# Patient Record
Sex: Female | Born: 2000 | Race: Black or African American | Hispanic: No | Marital: Single | Smoking: Never smoker
Health system: Southern US, Community
[De-identification: ages and names within clinical notes are randomized; demographics above are authoritative.]

---

## 2019-12-07 ENCOUNTER — Encounter (HOSPITAL_COMMUNITY): Payer: Self-pay | Admitting: Emergency Medicine

## 2019-12-07 ENCOUNTER — Emergency Department (HOSPITAL_COMMUNITY)
Admission: EM | Admit: 2019-12-07 | Discharge: 2019-12-07 | Disposition: A | Discharge status: Home / Self Care | Payer: BC Managed Care – PPO | Attending: Emergency Medicine | Admitting: Emergency Medicine

## 2019-12-07 ENCOUNTER — Other Ambulatory Visit: Payer: Self-pay

## 2019-12-07 DIAGNOSIS — Z3202 Encounter for pregnancy test, result negative: Secondary | ICD-10-CM | POA: Diagnosis not present

## 2019-12-07 DIAGNOSIS — N926 Irregular menstruation, unspecified: Secondary | ICD-10-CM | POA: Diagnosis present

## 2019-12-07 LAB — I-STAT BETA HCG BLOOD, ED (MC, WL, AP ONLY): I-stat hCG, quantitative: <5 m[IU]/mL (ref <5)

## 2019-12-07 NOTE — Discharge Instructions (Signed)
Follow up with primary care doctor or Obgyn

## 2019-12-07 NOTE — ED Triage Notes (Signed)
Pt reports she is 8 days late for her period. LMP 11/01/19.

## 2019-12-07 NOTE — ED Notes (Signed)
Patient verbalizes understanding of discharge instructions. Opportunity for questioning and answers were provided. Armband removed by staff, pt discharged from ED ambulatory by self\  

## 2019-12-07 NOTE — ED Provider Notes (Signed)
[facility] EMERGENCY DEPARTMENT Provider Note   CSN: [id number] Arrival date & time: 12/07/19  2220     History Chief Complaint  Patient presents with  . Possible Pregnancy    Karen Maynard is a 19 y.o. female with no significant past medical history who presents for evaluation of abnormal menstrual cycle.  Sexually active with 1 female partner.  Denies concerns for STDs.  Denies fever, chills, nausea vomiting, chest pain, shortness of breath abdominal pain, pelvic pain, vaginal discharge, dysuria or constipation.  LMP 11/01/2019.  States she normally has consistent cycles.  She is not followed by PCP or OB/GYN.  Concern for chance of pregnancy and here to have testing.  History obtained from patient and past medical records.  No interpreter is used.  HPI     History reviewed. No pertinent past medical history.  There are no problems to display for this patient.   History reviewed. No pertinent surgical history.   OB History   No obstetric history on file.     No family history on file.  Social History   Tobacco Use  . Smoking status: Not on file  Substance Use Topics  . Alcohol use: Not on file  . Drug use: Not on file    Home Medications Prior to Admission medications   Not on File    Allergies    Patient has no known allergies.  Review of Systems   Review of Systems  HENT: Negative.   Respiratory: Negative.   Cardiovascular: Negative.   Gastrointestinal: Negative.   Genitourinary: Positive for menstrual problem. Negative for decreased urine volume, difficulty urinating, dysuria, flank pain, frequency, genital sores, hematuria, pelvic pain, urgency, vaginal bleeding, vaginal discharge and vaginal pain.  Musculoskeletal: Negative.   Skin: Negative.   Neurological: Negative.   All other systems reviewed and are negative.   Physical Exam Updated Vital Signs BP 96/83 (BP Location: Right Arm)   Pulse 85   Temp 98.6 F (37 C) (Oral)    Resp 16   LMP 11/01/2019   SpO2 100%   Physical Exam Vitals and nursing note reviewed.  Constitutional:      General: She is not in acute distress.    Appearance: She is well-developed. She is not ill-appearing, toxic-appearing or diaphoretic.  HENT:     Head: Normocephalic and atraumatic.     Nose: Nose normal.     Mouth/Throat:     Mouth: Mucous membranes are moist.  Eyes:     Pupils: Pupils are equal, round, and reactive to light.  Cardiovascular:     Rate and Rhythm: Normal rate.     Pulses: Normal pulses.  Pulmonary:     Effort: Pulmonary effort is normal. No respiratory distress.  Abdominal:     General: Bowel sounds are normal. There is no distension.     Palpations: There is no mass.     Tenderness: There is no abdominal tenderness. There is no right CVA tenderness, left CVA tenderness, guarding or rebound.     Hernia: No hernia is present.  Genitourinary:    Comments: Declines GU exam- No concerns for STDs Musculoskeletal:        General: Normal range of motion.     Cervical back: Normal range of motion.  Skin:    General: Skin is warm and dry.     Capillary Refill: Capillary refill takes less than 2 seconds.  Neurological:     Mental Status: She is alert.  Comments: Ambulatory in ED without difficulty.     ED Results / Procedures / Treatments   Labs (all labs ordered are listed, but only abnormal results are displayed) Labs Reviewed  I-STAT BETA HCG BLOOD, ED ([facility], [facility], [facility] ONLY)    EKG None  Radiology No results found.  Procedures Procedures (including critical care time)  Medications Ordered in ED Medications - No data to display  ED Course  I have reviewed the triage vital signs and the nursing notes.  Pertinent labs & imaging results that were available during my care of the patient were reviewed by me and considered in my medical decision making (see chart for details).  68 old presents for evaluation of concern for pregnancy.  Last  menstrual cycle 11/01/2019, 8 days late.  Normally has "regular" menstrual cycles.  Sexually active with one partner.  Denies any concerns for any STDs, vaginal discharge or pelvic pain.  Pregnancy test here negative.  Will have her follow-up outpatient with OB/GYN.  The patient has been appropriately medically screened and/or stabilized in the ED. I have low suspicion for any other emergent medical condition which would require further screening, evaluation or treatment in the ED or require inpatient management.  Patient is hemodynamically stable and in no acute distress.  Patient able to ambulate in department prior to ED.  Evaluation does not show acute pathology that would require ongoing or additional emergent interventions while in the emergency department or further inpatient treatment.  I have discussed the diagnosis with the patient and answered all questions.  Pain is been managed while in the emergency department and patient has no further complaints prior to discharge.  Patient is comfortable with plan discussed in room and is stable for discharge at this time.  I have discussed strict return precautions for returning to the emergency department.  Patient was encouraged to follow-up with PCP/specialist refer to at discharge.    MDM Rules/Calculators/A&P                       Final Clinical Impression(s) / ED Diagnoses Final diagnoses:  Encounter for pregnancy test with result negative    Rx / DC Orders ED Discharge Orders    None       Brena Windsor A, PA-C 12/07/19 2305    Wyvonnia Dusky, MD 12/08/19 [phone]

## 2020-01-10 ENCOUNTER — Encounter (HOSPITAL_COMMUNITY): Payer: Self-pay | Admitting: Emergency Medicine

## 2020-01-10 ENCOUNTER — Other Ambulatory Visit: Payer: Self-pay

## 2020-01-10 ENCOUNTER — Emergency Department (HOSPITAL_COMMUNITY): Payer: BC Managed Care – PPO

## 2020-01-10 ENCOUNTER — Emergency Department (HOSPITAL_COMMUNITY)
Admission: EM | Admit: 2020-01-10 | Discharge: 2020-01-10 | Disposition: A | Discharge status: Home / Self Care | Payer: BC Managed Care – PPO | Attending: Emergency Medicine | Admitting: Emergency Medicine

## 2020-01-10 DIAGNOSIS — R0789 Other chest pain: Secondary | ICD-10-CM

## 2020-01-10 DIAGNOSIS — M549 Dorsalgia, unspecified: Secondary | ICD-10-CM | POA: Diagnosis not present

## 2020-01-10 DIAGNOSIS — S39012A Strain of muscle, fascia and tendon of lower back, initial encounter: Secondary | ICD-10-CM

## 2020-01-10 LAB — URINALYSIS, ROUTINE W REFLEX MICROSCOPIC
Bilirubin Urine: NEGATIVE
Glucose, UA: NEGATIVE mg/dL
Ketones, ur: NEGATIVE mg/dL
Nitrite: NEGATIVE
Protein, ur: NEGATIVE mg/dL
Specific Gravity, Urine: 1.02 (ref 1.005–1.030)
pH: 6 (ref 5.0–8.0)

## 2020-01-10 LAB — COMPREHENSIVE METABOLIC PANEL
ALT: 17 U/L (ref 0–44)
AST: 21 U/L (ref 15–41)
Albumin: 3.5 g/dL (ref 3.5–5.0)
Alkaline Phosphatase: 48 U/L (ref 38–126)
Anion gap: 6 (ref 5–15)
BUN: 10 mg/dL (ref 6–20)
CO2: 26 mmol/L (ref 22–32)
Calcium: 8.7 mg/dL — ABNORMAL LOW (ref 8.9–10.3)
Chloride: 109 mmol/L (ref 98–111)
Creatinine, Ser: 0.69 mg/dL (ref 0.44–1.00)
GFR calc Af Amer: >60 mL/min (ref >60)
GFR calc non Af Amer: >60 mL/min (ref >60)
Glucose, Bld: 127 mg/dL — ABNORMAL HIGH (ref 70–99)
Potassium: 4 mmol/L (ref 3.5–5.1)
Sodium: 141 mmol/L (ref 135–145)
Total Bilirubin: 0.4 mg/dL (ref 0.3–1.2)
Total Protein: 6.4 g/dL — ABNORMAL LOW (ref 6.5–8.1)

## 2020-01-10 LAB — CBC WITH DIFFERENTIAL/PLATELET
Abs Immature Granulocytes: 0.01 10*3/uL (ref 0.00–0.07)
Basophils Absolute: 0 10*3/uL (ref 0.0–0.1)
Basophils Relative: 1 %
Eosinophils Absolute: 0.2 10*3/uL (ref 0.0–0.5)
Eosinophils Relative: 6 %
HCT: 39.3 % (ref 36.0–46.0)
Hemoglobin: 12.4 g/dL (ref 12.0–15.0)
Immature Granulocytes: 0 %
Lymphocytes Relative: 42 %
Lymphs Abs: 1.7 10*3/uL (ref 0.7–4.0)
MCH: 27.8 pg (ref 26.0–34.0)
MCHC: 31.6 g/dL (ref 30.0–36.0)
MCV: 88.1 fL (ref 80.0–100.0)
Monocytes Absolute: 0.2 10*3/uL (ref 0.1–1.0)
Monocytes Relative: 5 %
Neutro Abs: 1.8 10*3/uL (ref 1.7–7.7)
Neutrophils Relative %: 46 %
Platelets: 238 10*3/uL (ref 150–400)
RBC: 4.46 MIL/uL (ref 3.87–5.11)
RDW: 12.4 % (ref 11.5–15.5)
WBC: 4 10*3/uL (ref 4.0–10.5)
nRBC: 0 % (ref 0.0–0.2)

## 2020-01-10 LAB — I-STAT BETA HCG BLOOD, ED (MC, WL, AP ONLY): I-stat hCG, quantitative: <5 m[IU]/mL (ref <5)

## 2020-01-10 MED ORDER — IBUPROFEN 400 MG PO TABS
400.0000 mg | ORAL_TABLET | Freq: Once | ORAL | Status: AC
  Administered 2020-01-10: 400 mg via ORAL
  Filled 2020-01-10: qty 1

## 2020-01-10 NOTE — ED Notes (Signed)
Patient verbalizes understanding of discharge instructions. Opportunity for questioning and answers were provided. Armband removed by staff, pt discharged from ED.  

## 2020-01-10 NOTE — ED Provider Notes (Signed)
[facility] EMERGENCY DEPARTMENT Provider Note   CSN: [id number] Arrival date & time: 01/10/20  0353     History Chief Complaint  Patient presents with  . Chest Pain    Karen Maynard is a 19 y.o. female.  Patient c/o low back pain in the past day. Denies specific injury or strain. Symptoms acute onset, mild to moderate, dull, non radiating. No numbness/weakness. No radicular pain. Pt also notes transient sharp, midline chest pain, at rest, non radiating, lasted several seconds. No associated sob. No nv. No diaphoresis. No constant and/or pleuritic pain. No cough or uri symptoms. No fevers. No leg pain or swelling. No hx dvt or pe.   The history is provided by the patient.  Chest Pain Associated symptoms: back pain   Associated symptoms: no abdominal pain, no cough, no fever, no headache, no numbness, no palpitations, no shortness of breath, no vomiting and no weakness        History reviewed. No pertinent past medical history.  There are no problems to display for this patient.   History reviewed. No pertinent surgical history.   OB History   No obstetric history on file.     No family history on file.  Social History   Tobacco Use  . Smoking status: Not on file  Substance Use Topics  . Alcohol use: Not on file  . Drug use: Not on file    Home Medications Prior to Admission medications   Not on File    Allergies    Amoxicillin and Cephalexin  Review of Systems   Review of Systems  Constitutional: Negative for fever.  HENT: Negative for sore throat.   Eyes: Negative for redness.  Respiratory: Negative for cough and shortness of breath.   Cardiovascular: Positive for chest pain. Negative for palpitations and leg swelling.  Gastrointestinal: Negative for abdominal pain and vomiting.  Genitourinary: Negative for flank pain.  Musculoskeletal: Positive for back pain. Negative for neck pain.  Skin: Negative for rash.  Neurological:  Negative for weakness, numbness and headaches.  Hematological: Does not bruise/bleed easily.  Psychiatric/Behavioral: Negative for confusion.    Physical Exam Updated Vital Signs BP 119/77 (BP Location: Right Arm)   Pulse 71   Temp 98 F (36.7 C)   Resp 18   LMP 11/01/2019   SpO2 100%   Physical Exam Vitals and nursing note reviewed.  Constitutional:      Appearance: Normal appearance. She is well-developed.  HENT:     Head: Atraumatic.     Nose: Nose normal.     Mouth/Throat:     Mouth: Mucous membranes are moist.  Eyes:     General: No scleral icterus.    Conjunctiva/sclera: Conjunctivae normal.  Neck:     Trachea: No tracheal deviation.  Cardiovascular:     Rate and Rhythm: Normal rate and regular rhythm.     Pulses: Normal pulses.     Heart sounds: Normal heart sounds. No murmur heard.  No friction rub. No gallop.   Pulmonary:     Effort: Pulmonary effort is normal. No respiratory distress.     Breath sounds: Normal breath sounds.  Chest:     Chest wall: No tenderness.  Abdominal:     General: Bowel sounds are normal. There is no distension.     Palpations: Abdomen is soft. There is no mass.     Tenderness: There is no abdominal tenderness. There is no guarding or rebound.     Hernia: No  hernia is present.  Genitourinary:    Comments: No cva tenderness.  Musculoskeletal:        General: No swelling.     Cervical back: Normal range of motion and neck supple. No rigidity. No muscular tenderness.     Comments: T/L/S spine non tender, aligned, no step off.   Skin:    General: Skin is warm and dry.     Findings: No rash.  Neurological:     Mental Status: She is alert.     Comments: Alert, speech normal. Motor/sens grossly intact bil, steady gait.   Psychiatric:        Mood and Affect: Mood normal.     ED Results / Procedures / Treatments   Labs (all labs ordered are listed, but only abnormal results are displayed) Results for orders placed or performed  during the hospital encounter of 01/10/20  Comprehensive metabolic panel  Result Value Ref Range   Sodium 141 135 - 145 mmol/L   Potassium 4.0 3.5 - 5.1 mmol/L   Chloride 109 98 - 111 mmol/L   CO2 26 22 - 32 mmol/L   Glucose, Bld 127 (H) 70 - 99 mg/dL   BUN 10 6 - 20 mg/dL   Creatinine, Ser 6.76 0.44 - 1.00 mg/dL   Calcium 8.7 (L) 8.9 - 10.3 mg/dL   Total Protein 6.4 (L) 6.5 - 8.1 g/dL   Albumin 3.5 3.5 - 5.0 g/dL   AST 21 15 - 41 U/L   ALT 17 0 - 44 U/L   Alkaline Phosphatase 48 38 - 126 U/L   Total Bilirubin 0.4 0.3 - 1.2 mg/dL   GFR calc non Af Amer >60 >60 mL/min   GFR calc Af Amer >60 >60 mL/min   Anion gap 6 5 - 15  CBC with Differential  Result Value Ref Range   WBC 4.0 4.0 - 10.5 K/uL   RBC 4.46 3.87 - 5.11 MIL/uL   Hemoglobin 12.4 12.0 - 15.0 g/dL   HCT 19.5 36 - 46 %   MCV 88.1 80.0 - 100.0 fL   MCH 27.8 26.0 - 34.0 pg   MCHC 31.6 30.0 - 36.0 g/dL   RDW 09.3 26.7 - 12.4 %   Platelets 238 150 - 400 K/uL   nRBC 0.0 0.0 - 0.2 %   Neutrophils Relative % 46 %   Neutro Abs 1.8 1.7 - 7.7 K/uL   Lymphocytes Relative 42 %   Lymphs Abs 1.7 0.7 - 4.0 K/uL   Monocytes Relative 5 %   Monocytes Absolute 0.2 0 - 1 K/uL   Eosinophils Relative 6 %   Eosinophils Absolute 0.2 0 - 0 K/uL   Basophils Relative 1 %   Basophils Absolute 0.0 0 - 0 K/uL   Immature Granulocytes 0 %   Abs Immature Granulocytes 0.01 0.00 - 0.07 K/uL  Urinalysis, Routine w reflex microscopic  Result Value Ref Range   Color, Urine YELLOW YELLOW   APPearance HAZY (A) CLEAR   Specific Gravity, Urine 1.020 1.005 - 1.030   pH 6.0 5.0 - 8.0   Glucose, UA NEGATIVE NEGATIVE mg/dL   Hgb urine dipstick MODERATE (A) NEGATIVE   Bilirubin Urine NEGATIVE NEGATIVE   Ketones, ur NEGATIVE NEGATIVE mg/dL   Protein, ur NEGATIVE NEGATIVE mg/dL   Nitrite NEGATIVE NEGATIVE   Leukocytes,Ua TRACE (A) NEGATIVE   RBC / HPF 0-5 0 - 5 RBC/hpf   WBC, UA 0-5 0 - 5 WBC/hpf   Bacteria, UA RARE (A) NONE SEEN  Squamous  Epithelial / LPF 11-20 0 - 5  I-Stat beta hCG blood, ED  Result Value Ref Range   I-stat hCG, quantitative <5.0 <5 mIU/mL   Comment 3           DG Chest 2 View  Result Date: 01/10/2020 CLINICAL DATA:  Sharp chest pain EXAM: CHEST - 2 VIEW COMPARISON:  None. FINDINGS: The heart size and mediastinal contours are within normal limits. Both lungs are clear. The visualized skeletal structures are unremarkable. IMPRESSION: Negative chest. Electronically Signed   By: Monte Fantasia M.D.   On: 01/10/2020 04:34    EKG EKG Interpretation  Date/Time:  Monday January 10 2020 03:51:45 EDT Ventricular Rate:  73 PR Interval:  162 QRS Duration: 88 QT Interval:  404 QTC Calculation: 445 R Axis:   13 Text Interpretation: Normal sinus rhythm T wave abnormality, consider inferior ischemia Abnormal ECG Confirmed by Elnora Morrison [phone]) on 01/10/2020 7:22:55 AM   Radiology DG Chest 2 View  Result Date: 01/10/2020 CLINICAL DATA:  Sharp chest pain EXAM: CHEST - 2 VIEW COMPARISON:  None. FINDINGS: The heart size and mediastinal contours are within normal limits. Both lungs are clear. The visualized skeletal structures are unremarkable. IMPRESSION: Negative chest. Electronically Signed   By: Monte Fantasia M.D.   On: 01/10/2020 04:34    Procedures Procedures (including critical care time)  Medications Ordered in ED Medications  ibuprofen (ADVIL) tablet 400 mg (has no administration in time range)    ED Course  I have reviewed the triage vital signs and the nursing notes.  Pertinent labs & imaging results that were available during my care of the patient were reviewed by me and considered in my medical decision making (see chart for details).    MDM Rules/Calculators/A&P                          Labs sent. Cxr.   Reviewed nursing notes and prior charts for additional history.   CXR reviewed/interpreted by me - no pna.   Labs reviewed/interpreted by me - u preg neg. hgb normal. Chem  normal.  Ibuprofen po. Po fluids.  Symptoms/exam felt most c/w musculoskeletal pain.  Patient appears stable for d/c.   Rec pcp f/u.  Return precautions provided.    Final Clinical Impression(s) / ED Diagnoses Final diagnoses:  None    Rx / DC Orders ED Discharge Orders    None       Lajean Saver, MD 01/10/20 [phone]

## 2020-01-10 NOTE — Discharge Instructions (Addendum)
It was our pleasure to provide your ER care today - we hope that you feel better.  Overall, your xrays, labs, and ECG look good.   For back pain, avoid bending at waist, or heavy lifting > 20 lbs for the next week. Take acetaminophen or ibuprofen as need for pain.   Follow up with primary care doctor in 1 week if symptoms fail to improve/resolve.  Return to ER if worse, new symptoms, fevers, severe or intractable pain, numbness/weakness, recurrent or persistent chest pain, trouble breathing, or other concern.

## 2020-01-10 NOTE — ED Triage Notes (Signed)
Pt reports she woke up around 215 with sharp, central chest pain and cold sweats, also endorses some sob. resp e/u, nad.

## 2020-08-29 ENCOUNTER — Ambulatory Visit (HOSPITAL_COMMUNITY)
Admission: EM | Admit: 2020-08-29 | Discharge: 2020-08-29 | Disposition: A | Discharge status: Home / Self Care | Payer: BC Managed Care – PPO

## 2020-08-29 ENCOUNTER — Encounter (HOSPITAL_COMMUNITY): Payer: Self-pay

## 2020-08-29 ENCOUNTER — Other Ambulatory Visit: Payer: Self-pay

## 2020-08-29 DIAGNOSIS — R1011 Right upper quadrant pain: Secondary | ICD-10-CM

## 2020-08-29 DIAGNOSIS — R0789 Other chest pain: Secondary | ICD-10-CM

## 2020-08-29 MED ORDER — FAMOTIDINE 20 MG PO TABS: 20.0000 mg | ORAL_TABLET | Freq: Two times a day (BID) | ORAL | 0 refills | Status: AC

## 2020-08-29 MED ORDER — ALUM & MAG HYDROXIDE-SIMETH 200-200-20 MG/5ML PO SUSP
30.0000 mL | Freq: Once | ORAL | Status: AC
  Administered 2020-08-29: 30 mL via ORAL

## 2020-08-29 MED ORDER — ALUM & MAG HYDROXIDE-SIMETH 200-200-20 MG/5ML PO SUSP
ORAL | Status: AC
  Filled 2020-08-29: qty 30

## 2020-08-29 NOTE — Discharge Instructions (Addendum)
-  Head straight to ED if your pain starts getting worse again instead of better.  -Head to ED if left-sided chest pain/pressure that is not relieved by Pepcid/Tums. -I sent a prescription for Pepcid. This is an acid reducer that you can take up to twice daily as needed.  -If you experience this pain again, follow-up with your primary care for possible referral to GI doctor.

## 2020-08-29 NOTE — ED Triage Notes (Signed)
Pt presents with chest pain, middle back pain, right upper quadrant abdominal pain and nausea x 3 hrs. States started after eating pizza. Chest pain improved with ibuprofen. Denies vision changes, diarrhea, dizziness, headaches.

## 2020-08-29 NOTE — ED Provider Notes (Signed)
[facility]    CSN: [id number] Arrival date & time: 08/29/20  1814      History   Chief Complaint Chief Complaint  Patient presents with  . Chest Pain  . Back Pain  . Nausea    HPI Karen Maynard is a 20 y.o. female  Presenting for back pain, chest pain, and nausea without vomiting. No history of cardiopulmonary disease per pt, not family history early cardiac death. Patient states she ate pizza  6 hours ago and began experiencing burning midline back pain immediately after, followed closely by RUQ pain and substernal burning. States she immediately had a normal bowel movement following pizza, denies diarrhea. Endorses tingling down left arm. Denies left-sided chest pain, headaches, vision changes, diarrhea. Pt endorses occ GERD that is typically controlled on Tums, but she took Tums today without improvement in symptoms. Ibuprofen provided some relief.  HPI  History reviewed. No pertinent past medical history.  There are no problems to display for this patient.   History reviewed. No pertinent surgical history.  OB History   No obstetric history on file.      Home Medications    Prior to Admission medications   Medication Sig Start Date End Date Taking? Authorizing Provider  famotidine (PEPCID) 20 MG tablet Take 1 tablet (20 mg total) by mouth 2 (two) times daily. 08/29/20  Yes Rhys Martini, PA-C  Multiple Vitamin (MULTIVITAMIN) tablet Take 1 tablet by mouth daily.   Yes [provider]    Family History History reviewed. No pertinent family history.  Social History Social History   Tobacco Use  . Smoking status: Never Smoker  . Smokeless tobacco: Never Used     Allergies   Amoxicillin, Cefdinir, and Cephalexin   Review of Systems Review of Systems  Constitutional: Negative for appetite change, chills, diaphoresis, fever and unexpected weight change.  HENT: Negative for congestion, ear pain, sinus pressure, sinus pain, sneezing,  sore throat and trouble swallowing.   Respiratory: Negative for cough, chest tightness and shortness of breath.   Cardiovascular: Negative for chest pain.  Gastrointestinal: Positive for abdominal pain and nausea. Negative for abdominal distention, anal bleeding, blood in stool, constipation, diarrhea, rectal pain and vomiting.  Genitourinary: Negative for dysuria, flank pain, frequency and urgency.  Musculoskeletal: Positive for back pain. Negative for myalgias.  Neurological: Negative for dizziness, light-headedness and headaches.     Physical Exam Triage Vital Signs ED Triage Vitals  Enc Vitals Group     BP 08/29/20 1834 (!) 126/54     Pulse Rate 08/29/20 1834 90     Resp 08/29/20 1834 (!) 22     Temp 08/29/20 1834 98.3 F (36.8 C)     Temp Source 08/29/20 1834 Oral     SpO2 08/29/20 1834 97 %     Weight --      Height --      Head Circumference --      Peak Flow --      Pain Score 08/29/20 1832 5     Pain Loc --      Pain Edu? --      Excl. in GC? --    No data found.  Updated Vital Signs BP (!) 126/54 (BP Location: Left Arm)   Pulse 90   Temp 98.3 F (36.8 C) (Oral)   Resp (!) 22   LMP  (Within Days)   SpO2 97%   Visual Acuity Right Eye Distance:   Left Eye Distance:  Bilateral Distance:    Right Eye Near:   Left Eye Near:    Bilateral Near:     Physical Exam Vitals reviewed.  Constitutional:      General: She is not in acute distress.    Appearance: Normal appearance. She is not ill-appearing.  HENT:     Head: Normocephalic and atraumatic.  Cardiovascular:     Rate and Rhythm: Normal rate and regular rhythm.     Heart sounds: Normal heart sounds.  Pulmonary:     Effort: Pulmonary effort is normal.     Breath sounds: Normal breath sounds. No decreased breath sounds, wheezing, rhonchi or rales.  Abdominal:     General: Bowel sounds are normal. There is no distension.     Palpations: Abdomen is soft. There is no mass.     Tenderness: There is  abdominal tenderness. There is no right CVA tenderness, left CVA tenderness, guarding or rebound. Positive signs include Murphy's sign. Negative signs include Rovsing's sign and McBurney's sign.     Comments: Bowel sounds positive in all 4 quadrants. Positive [employer].  Neurological:     General: No focal deficit present.     Mental Status: She is alert and oriented to person, place, and time.  Psychiatric:        Mood and Affect: Mood normal.        Behavior: Behavior normal.      UC Treatments / Results  Labs (all labs ordered are listed, but only abnormal results are displayed) Labs Reviewed - No data to display  EKG   Radiology No results found.  Procedures Procedures (including critical care time)  Medications Ordered in UC Medications  alum & mag hydroxide-simeth (MAALOX/MYLANTA) 200-200-20 MG/5ML suspension 30 mL (30 mLs Oral Given 08/29/20 1938)    Initial Impression / Assessment and Plan / UC Course  I have reviewed the triage vital signs and the nursing notes.  Pertinent labs & imaging results that were available during my care of the patient were reviewed by me and considered in my medical decision making (see chart for details).     Pt presenting with burning substernal and midline back pain following eating pizza. Positive Murphy sign. GI cocktail administered with significant improvement in RUQ pain. EK NSR. Murphy sign initially positive; following GI cocktail, Murphy sign negative. Ddx is GERD vs cholecystitis.   Discharging this patient to home with STRICT return precautions. She understands that if her pain starts getting worse again instead of better, she must head immediately to ED for evaluation of possible cholecystitis. She understands that if pain resolves today but returns in the future, she must head to ED or PCP for further evaluation/GI referral. She understands to head to ED immediately if left-sided chest pain at rest, burning/ripping chest pain  that does not improve with Tums/Pepcid, etc. Patient verbalizes understanding and agreement multiple times.   Spent over 40 minutes obtaining H&P, performing physical, interpreting EKG, discussing results, treatment plan and plan for follow-up with patient. Patient agrees with plan.    Final Clinical Impressions(s) / UC Diagnoses   Final diagnoses:  Atypical chest pain  Abdominal pain, right upper quadrant     Discharge Instructions     -Head straight to ED if your pain starts getting worse again instead of better.  -Head to ED if left-sided chest pain/pressure that is not relieved by Pepcid/Tums. -If you experience this pain again, follow-up with your primary care for possible referral to GI doctor.  ED Prescriptions    Medication Sig Dispense Auth. Provider   famotidine (PEPCID) 20 MG tablet Take 1 tablet (20 mg total) by mouth 2 (two) times daily. 30 tablet Rhys Martini, PA-C     PDMP not reviewed this encounter.   Rhys Martini, PA-C 08/29/20 2003

## 2021-07-12 IMAGING — CR DG CHEST 2V
2 series · 2 of 2 positions shown · non-contrast
Comparison: None.

CLINICAL DATA: Sharp chest pain

EXAM:
CHEST - 2 VIEW

[chest pa]
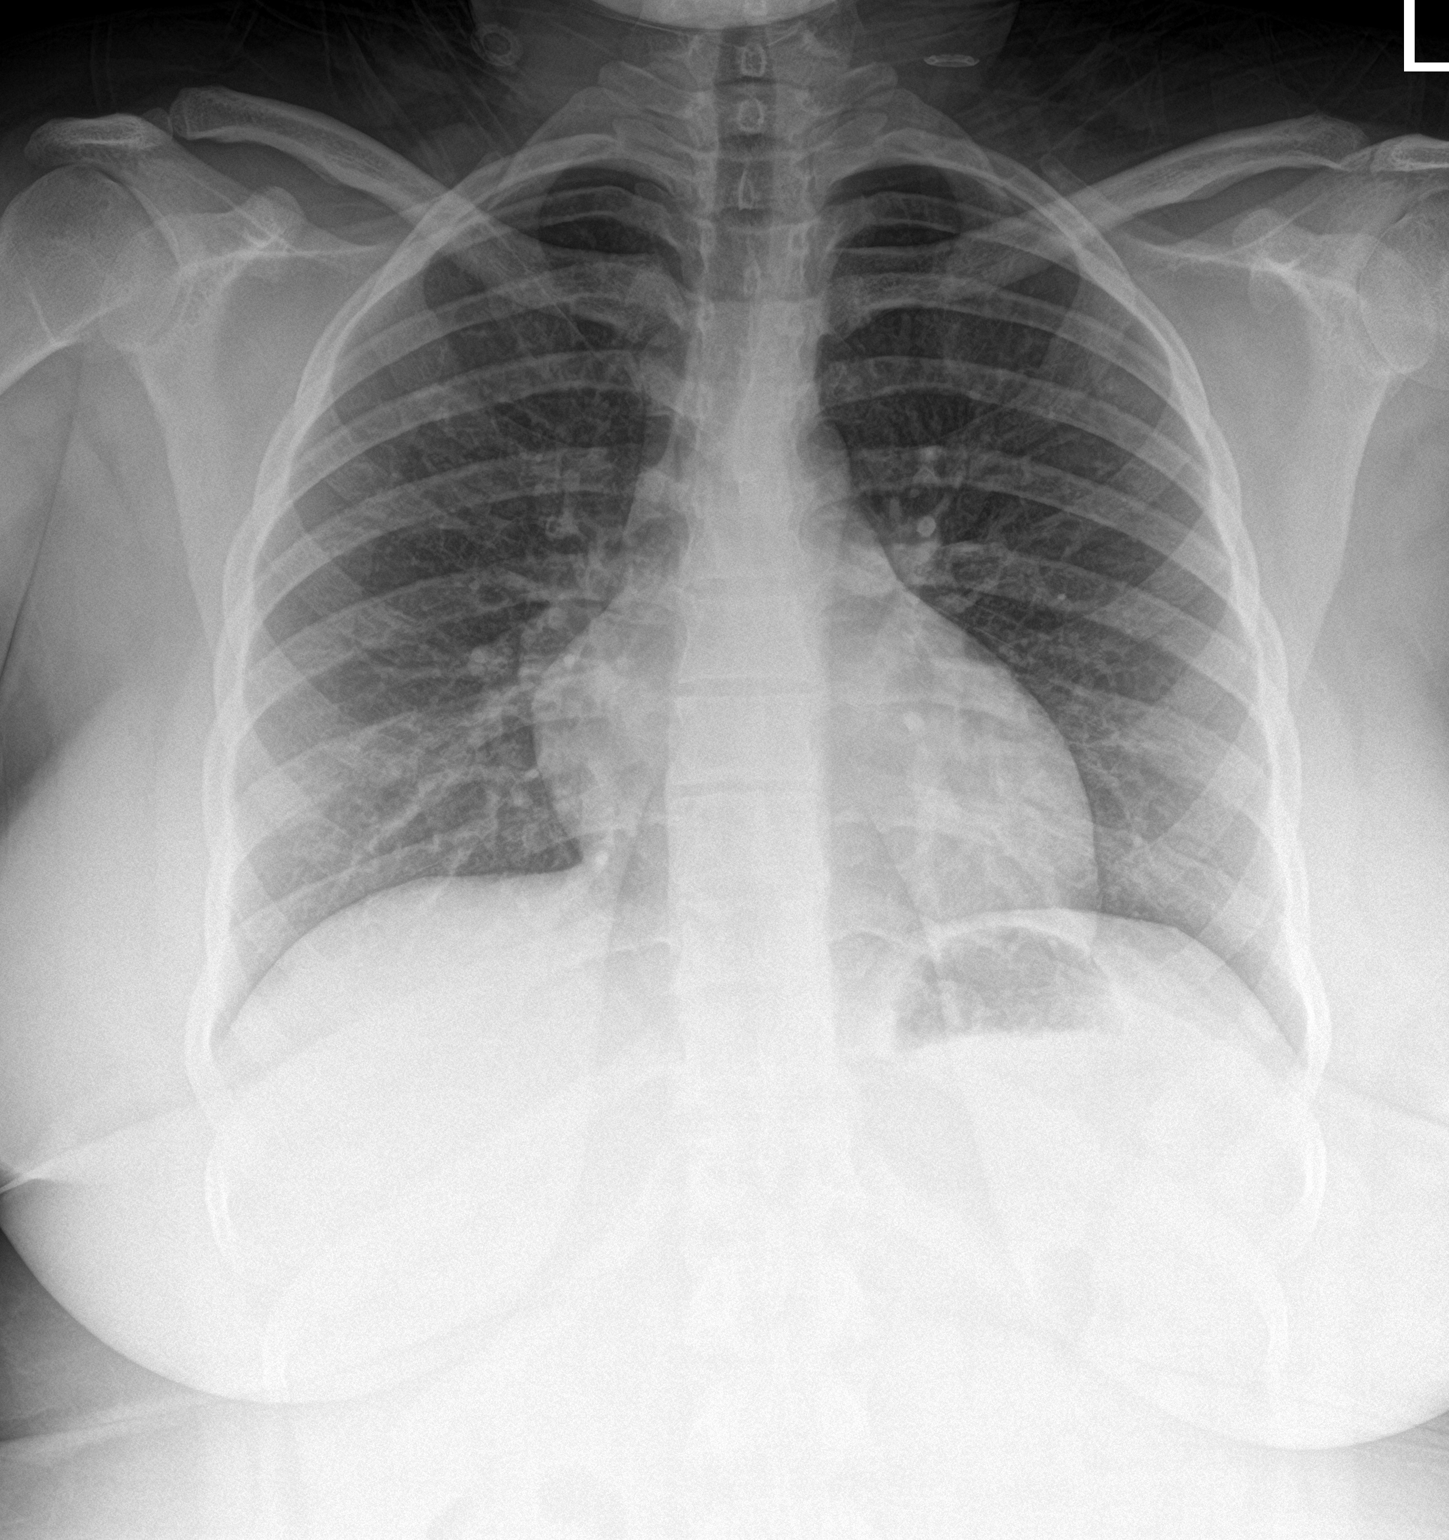

[chest lat]
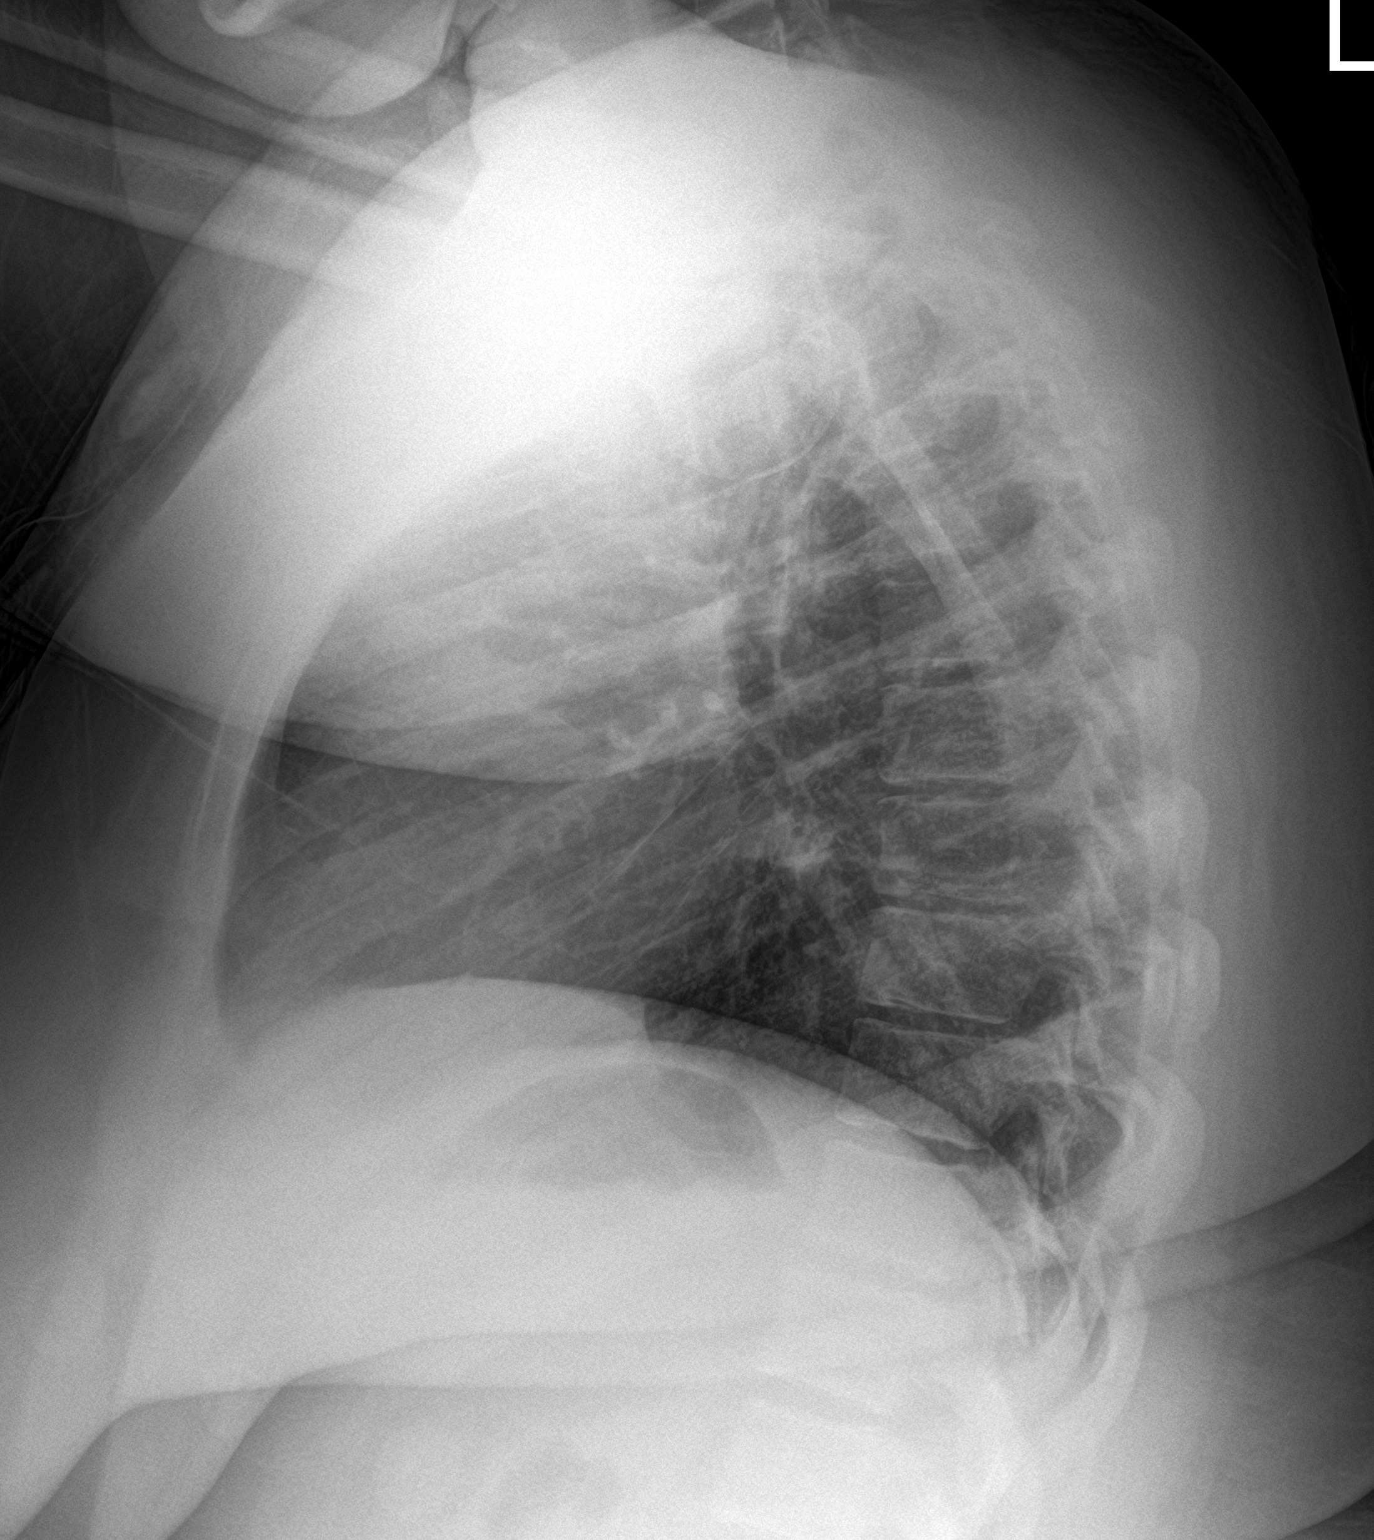

[2 of 2 positions shown; findings below may reference images not displayed]

FINDINGS: The heart size and mediastinal contours are within normal limits.
Both lungs are clear. The visualized skeletal structures are
unremarkable.
IMPRESSION: Negative chest.
